# Patient Record
Sex: Female | Born: 1965 | Race: White | Hispanic: No | Marital: Married
Health system: Midwestern US, Community
[De-identification: ages and names within clinical notes are randomized; demographics above are authoritative.]

## PROBLEM LIST (undated history)

## (undated) DIAGNOSIS — M75102 Unspecified rotator cuff tear or rupture of left shoulder, not specified as traumatic: Secondary | ICD-10-CM

## (undated) DIAGNOSIS — M25512 Pain in left shoulder: Secondary | ICD-10-CM

---

## 2016-08-28 ENCOUNTER — Inpatient Hospital Stay: Admit: 2016-08-28 | Payer: PRIVATE HEALTH INSURANCE

## 2016-08-28 ENCOUNTER — Encounter

## 2016-08-28 DIAGNOSIS — M75102 Unspecified rotator cuff tear or rupture of left shoulder, not specified as traumatic: Secondary | ICD-10-CM

## 2016-09-27 ENCOUNTER — Emergency Department: Admit: 2016-09-28 | Payer: PRIVATE HEALTH INSURANCE

## 2016-09-27 DIAGNOSIS — S62145A Nondisplaced fracture of body of hamate [unciform] bone, left wrist, initial encounter for closed fracture: Secondary | ICD-10-CM

## 2016-09-27 NOTE — Discharge Instructions (Signed)
Take new medication as directed.  Do not take this medication if you have to drive, work or operate machinery as it may cause dizziness or drowsiness.  The sling has been provided for comfort.  Be sure to remove the sling several times a day to perform gentle range of motion exercises for the shoulder.  Follow-up with your orthopedist.
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
## 2016-09-27 NOTE — ED Notes (Signed)
Ice pack given to pt     Duane Boston, LPN  16/10/96 0454

## 2016-09-27 NOTE — ED Provider Notes (Signed)
Attending Supervisory Note/Shared Visit   I have personally performed a face to face diagnostic evaluation on this patient. I have reviewed the mid-level???s findings and agree.        (Please note that portions of this note were completed with a voice recognition program.  Efforts were made to edit the dictations but occasionally words are mis-transcribed.)    Wess BottsSyed Z Lulie Hurd, MD  Attending Emergency Physician        Wess BottsSyed Z Temeca Somma, MD  09/28/16 [phone]
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
## 2016-09-28 ENCOUNTER — Inpatient Hospital Stay
Admit: 2016-09-28 | Discharge: 2016-09-28 | Disposition: A | Discharge status: Home / Self Care | Payer: PRIVATE HEALTH INSURANCE | Attending: Emergency Medicine

## 2016-09-28 MED ORDER — HYDROCODONE-ACETAMINOPHEN 5-325 MG PO TABS
5-325 MG | Freq: Once | ORAL | Status: AC
Start: 2016-09-28 — End: 2016-09-27
  Administered 2016-09-28: 04:00:00 1 via ORAL

## 2016-09-28 MED ORDER — HYDROCODONE-ACETAMINOPHEN 5-325 MG PO TABS
5-325 MG | ORAL_TABLET | Freq: Four times a day (QID) | ORAL | 0 refills | Status: AC | PRN
Start: 2016-09-28 — End: 2016-10-04

## 2016-09-28 MED FILL — HYDROCODONE-ACETAMINOPHEN 5-325 MG PO TABS: 5-325 MG | ORAL | Qty: 1

## 2016-09-28 NOTE — ED Notes (Signed)
Pt educated on use of sling and to contact ortho in the am for follow up. Pt advised that splint would take 24 hrs to completely dry. Pt advised to keep in propped on pillows and to rest and ice. Pt advised to take motrin or tylenol as needed. Pt had no further questions.      Tye Juarez ScheJoesph Julyrtz, RN  09/28/16 [phone]

## 2017-12-06 ENCOUNTER — Encounter

## 2017-12-09 ENCOUNTER — Ambulatory Visit: Admit: 2017-12-09 | Discharge: 2017-12-09 | Payer: PRIVATE HEALTH INSURANCE

## 2017-12-09 ENCOUNTER — Ambulatory Visit: Admit: 2017-12-09 | Discharge: 2017-12-09 | Payer: PRIVATE HEALTH INSURANCE | Attending: Orthopaedic Surgery

## 2017-12-09 ENCOUNTER — Encounter: Attending: Orthopaedic Surgery

## 2017-12-09 DIAGNOSIS — G5602 Carpal tunnel syndrome, left upper limb: Secondary | ICD-10-CM

## 2017-12-09 DIAGNOSIS — M25512 Pain in left shoulder: Secondary | ICD-10-CM

## 2017-12-09 MED ORDER — LIDOCAINE HCL 1 % IJ SOLN
1 % | Freq: Once | INTRAMUSCULAR | Status: AC
Start: 2017-12-09 — End: 2017-12-10
  Administered 2017-12-10: 19:00:00 2 mL via INTRA_ARTICULAR

## 2017-12-09 MED ORDER — METHYLPREDNISOLONE ACETATE 40 MG/ML IJ SUSP
40 MG/ML | Freq: Once | INTRAMUSCULAR | Status: AC
Start: 2017-12-09 — End: 2017-12-10
  Administered 2017-12-10: 19:00:00 40 mg via INTRA_ARTICULAR

## 2017-12-09 NOTE — Progress Notes (Signed)
[facility]  [facility]THOPED[facility] AND SPORTS MEDICINE  [address]  [place] [address]  Dept: [phone]  Dept Fax: [phone]          Left Shoulder - Follow Up     Chief Complaint:     Chief Complaint   Patient presents with   ??? Shoulder Pain     Left shoulder pain ongoing pain does radiate to elbow and pain level is a 4 out of 10     HPI:     Kristen Barnes is a 52 y.o. year old left hand dominant female that has had pain in the left shoulder for years. As far as any trauma to the shoulder, the patient indicates no injury. The pain is worse at night and when doing overhead activities. Weakness of the shoulder has been noted. The pain restricts activities such as moving the arm and lifting things above the head. The pain does not seem to improve with time. The following medications have been tried: Aleve and the patient cannot take pain relievers without falling asleep. The patient has had a side lying corticosteroid injection into the glenohumeral joint on 10/24/2016 with good pain relief. The patient has tried physical therapy. The patient has not has surgery but she has had surgery on the opposite shoulder. The opposite shoulder is okay. Neck pain has not been present. Patient also stated the shoulder pain radiates down to the elbow and it is a 4/10. She also indicated that she has numbness and tingling in her left hand as well and the elbow locks from time to time.     Review of Systems   Constitutional: Positive for activity change. Negative for appetite change, fatigue and fever.   Respiratory: Negative for apnea, cough, chest tightness and shortness of breath.    Cardiovascular: Negative for chest pain, palpitations and leg swelling.   Gastrointestinal: Negative for abdominal distention, abdominal pain, constipation, diarrhea, nausea and vomiting.   Genitourinary: Negative for difficulty urinating, dysuria and hematuria.   Musculoskeletal: Positive for arthralgias.  Negative for gait problem, joint swelling and myalgias.   Skin: Negative for color change and rash.   Neurological: Negative for dizziness, weakness, numbness and headaches.   Psychiatric/Behavioral: Negative for sleep disturbance.     Past Medical History:    History reviewed. No pertinent past medical history.    Past Surgical History:    Past Surgical History:   Procedure Laterality Date   ??? CARPAL TUNNEL RELEASE Bilateral    ??? CESAREAN SECTION      x2   ??? HYSTERECTOMY     ??? SHOULDER SURGERY Right      Current Medications:   No current outpatient medications on file.     No current facility-administered medications for this visit.      Allergies:    Codeine    Social History:   Social History     Socioeconomic History   ??? Marital status: Married     Spouse name: None   ??? Number of children: None   ??? Years of education: None   ??? Highest education level: None   Occupational History   ??? None   Social Needs   ??? Financial resource strain: None   ??? Food insecurity:     Worry: None     Inability: None   ??? Transportation needs:     Medical: None     Non-medical: None   Tobacco Use   ???  Smoking status: Never Smoker   ??? Smokeless tobacco: Never Used   Substance and Sexual Activity   ??? Alcohol use: No   ??? Drug use: No   ??? Sexual activity: None   Lifestyle   ??? Physical activity:     Days per week: None     Minutes per session: None   ??? Stress: None   Relationships   ??? Social connections:     Talks on phone: None     Gets together: None     Attends religious service: None     Active member of club or organization: None     Attends meetings of clubs or organizations: None     Relationship status: None   ??? Intimate partner violence:     Fear of current or ex partner: None     Emotionally abused: None     Physically abused: None     Forced sexual activity: None   Other Topics Concern   ??? None   Social History Narrative   ??? None     Family History:  History reviewed. No pertinent family history.    I have reviewed the CC, HPI,  ROS, PMH, FHX, Social History, and if not present in this note, I have reviewed in the patient's chart. I agree with the documentation provided by other staff and have reviewed their documentation prior to providing my signature indicating agreement.    Vitals:   BP 135/83    Pulse 87    Resp 20    Ht  (1.575 m)    Wt 268 lb (121.6 kg)    BMI 49.02 kg/m??  Body mass index is 49.02 kg/m??.  Physical Examination:     Orthopedics:    GENERAL: Alert and oriented X3 in no acute distress.  SKIN: Intact without lesions or ulcerations.  NEURO: Musculoskeletal and axillary nerves intact to sensory and motor testing.  VASC: Capillary refill is less than 3 seconds.    Left Shoulder Exam    GEN:  Alert and oriented X 3, in no acute distress.  SKIN:  Intact without rashes, lesions, or ulcerations.  Incisions are well healed.  NEURO:  Musculoskeletal ans axillary nerves intact to sensory and motor testing.  VASC:  Cap refill less than than 3 secs. Negative Adson's test, Negative Roo's test.  ROM: 160 degrees of forward elevation, 45 degrees of external rotation in neutral, 90 degrees of external rotation in abduction, internal rotation to L1.    STRENGTH: Supraspinatus 4+/5, external rotators 4+/5.    MUSC: No atrophy, negative subscap lift off or belly press test.  IMP:  (-) Neer's sign, (-) Hawkin's sign, (-) Coracoid impingement, No painful arc, No pain with cross body abduction.  PALP: No pain over anterolateral acromion, No pain over AC joint, No pain over traps/rhomboids, bicep pain  INST: (+) O'Brien's test, (-) Speed's test.    Cervical Exam    GEN:  Alert and oriented X 3 in no acute distress  GAIT:  Normal speed and steady  SKIN:  Intact without lesions or ulcerations.  ROM: Cervical spine contour has normal lordosis.  ROM is not painful.  There is no limitation of:   Forward flexion, Extension, Right side bending, Right rotation or Left rotation. There is decreased Left side bending.  NEURO: Sensation intact to  light touch over C-5 through T-1 dermatomes.            Bicep: 2+/4  Tricep: 2+/4          Brachioradialis. 2+]/4   VASC: Cap refill less than 2 sec.  negative Adson, Roos and hyperabduction tests.    MUSC: Strength 5/5 trapezius, 5/5 deltoid, 5/5 bicep, 5/5 tricep, 5/5 wrist extensors, 5/5 finger abduction/adduction.   TESTS:    Hoffman's reflex: negative   Diadochokinesis test: negative    Babinski: negative    Clonus: negative    Spurling's maneuver: negative     Lhermitte's sign: negative   Shoulder abduction relief sign,  negative      Waddell's sign: negative     Left Elbow Exam    Gen:  Intact without rashes, lesions, or ulcerations.    Vasc: Cap refill is less than 3 seconds.      Neuro: Radial, ulnar, and median nerves are intact to sensory and motor testing. Bicep, tricep, and brachioradialis reflexes are +2/4.  Inspection: The patient is tender to palpation over the anterior aspect of the elbow. There is no effusion. No obvious deformity of the elbow.    ROM: 145 degrees of flexion, 0 degrees of extension, 85 degrees of supination, 90 degrees of pronation.     Test: Ligamentous exam is stable to varus and valgus stress testing at 0 and 30 degrees. Slightly positive Tinel's sign at elbow. No pain on resisted wrist extension.     Orthopedics     GENERAL: Alert and oriented X3 in no acute distress.  SKIN: Visual inspection reveals no soft tissue swelling or boney abnormality, no rotational deformity, Intact without lesions or ulcerations.  NEURO: Radial, Ulnar and Median nerves are intact to sensory and motor testing.  VASC: Capillary refill is less than 3 seconds, no signs of thoracic outlet syndrome, negative Allen's test    Hand/Finger/Wrist Exam    LOCATION: Left Wrist  MUSC: Good strength is available in these motions.  WRIST: No pain to palpation. No boney pain or instability to palpation.  WRIST TESTS: Negative finkelstein's, (+) Tinel's test at the left wrist, Negative Phalen's, Negative  Durkin Compression  ROM: Full flexor and extensor tendon function is intact, hands and fingers are moving well  PALPATION: Pain with resisted finger extension.     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 Incomplete rotator cuff tear or rupture of left shoulder, not specified as traumatic        Procedures:    Procedure: yes    Carpal Tunnel Syndrome Injection    Location: Left Wrist  Procedure: Corticosteroid injection into the left wrist. The patient was placed in the supine position on the exam table. The carpal tunnel portal was identified and marked. The skin was prepped with betadine in a sterile fashion. Utilizing ultrasound for precise placement and clean technique with sterile gloves a 3 cc solution containing 2 cc of 1.0% Lidocaine with 1cc containing  of Depo-medrol was injected. There was no resistance to the injection. The wound was cleansed and a band-aid was placed. The patient tolerated the procedure without difficulty. Adverse reactions to the injection were discussed with the patient including signs of infection (increasing pain, redness, swelling) and the patient was instructed to call immediately if experiencing any of these symptoms.     Radiology:   MRI from 08/28/16 was reviewed of the left shoulder.  Impression   Small near full-thickness tear of the distal supraspinatus tendon.   ??   -----------------------------------------------------------------------------------------------------------------------------  SHOULDER X-RAY    Two views  of the left shoulder and 2 views of the scapula, including AP, scapular Y, outlet and axillary views reveal: The glenohumeral joint is well reduced without arthritic changes. Proximal migration of the humeral head has not occurred.  Acromion is a type II. The acromioclavicular joint shows moderate degenerative changes.      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 We discussed the etiologies and natural histories of Double crush syndrome in the neck, rotator cuff syndrome in the left shoulder, carpal tunnel syndrome in the left wrist and cubital syndrome of the left elbow. We discussed the various treatment alternatives including anti-inflammatory medications, physical therapy, injections, further imaging studies and as a last result surgery. 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 I then told her that it may be best for [place] to get an EMG nerve test to determine what is causing the nerves to be impinged recreating the paresthesia in the hand. I also told her that I feel she may have a degenerative tear of the rotator cuff in her shoulder that may be aggravating the shoulder as well. From there, I told her that it may help if we get an MRI of the neck and it may also help if we get her into a night splint to combat the carpal tunnel and cubital tunnel syndrome. I also told her that we can try an injection into the wrist to help reduce her paresthesia in the hand and fingers. The patient then stated that she would like to do the night splint and a cortisone injection into the wrist. She will also refrain from resting her elbow on things so it does not exacerbate the pain in her elbow. At this time, the patient has opted for a carpal tunnel night splint and a cortisone injection into the carpal tunnel of the left wrist to help reduce inflammation and pain. The injection site should never get red, hot, or swollen and if it does the patient will contact our office right away. The patient may experience a increase in soreness the first  24-48 hours due to a cortisone flair and can take anti-inflammatories for a short period of time to reduce that soreness. The patient should not submerge the injection site in water for a minimum of 24 hours to avoid infection. This means no lakes, pools, ponds, or hot tubs for 24 hours. If the patient is diabetic the injection may increase their blood sugar for up to one week. The patient can do this cortisone injection once every 3 months as needed. If the injections stop working and do not give the patient relief the patient should consider surgical interventions to produce long term relief. Patient should return to the clinic in 6 weeks to follow up with Ross Ludwig PA-C, ATC if she is not better. The patient will call the office immediately with any problems.      Orders Placed This Encounter   Medications   ??? lidocaine 1 % injection 2 mL   ??? methylPREDNISolone acetate (DEPO-MEDROL)  injection 40 mg     No orders of the defined types were placed in this encounter.    Bethena Midget Day V, am scribing for and in the presence of Linde Gillis D.O.  12/15/2017  9:24 PM    I, Linde Gillis DO, have personally seen this patient, reviewed the chart including history, and imaging if done. I personally  performed the physical exam and obtained any needed additional history. I placed orders, performed or supervised procedures and developed the treatment plan.    Electronically signed by Liston Alba, DO, on 12/15/2017 at 9:24 PM

## 2018-01-21 NOTE — Progress Notes (Signed)
Is 82     [facility]  [facility]  [address]  [place] [id number]  Dept: [phone]  Dept Fax: [phone]        Left Shoulder - Follow Up     Chief Complaint:     Chief Complaint   Patient presents with   ??? Follow-up     Lt wrist rechech, carpal tunnel syndrome. Cortisone injection 12/09/17, pt states that it helped.      HPI:     Kristen Barnes is a [age] y.o. year old right hand dominant female that has had pain in the left shoulder for years. As far as any trauma to the shoulder, the patient indicates no injury. The pain is worse at night and when doing overhead activities. Weakness of the shoulder has been noted. The pain restricts activities such as moving the arm and lifting things above the head. The pain does not seem to improve with time. The following medications have been tried: Aleve and the patient cannot take pain relievers without falling asleep. The patient has had a side lying corticosteroid injection into the glenohumeral joint on 10/24/2016 with good pain relief. The patient has tried physical therapy. The patient has not has surgery but she has had surgery on the opposite shoulder. The opposite shoulder is okay. Neck pain has not been present. Patient also stated the shoulder pain radiates down to the elbow and it is a 4/10. She also indicated that she has numbness and tingling in her left hand as well and the elbow locks from time to time. Patient states that her wrist feels a lot better since the injection she received on 12/09/17.     Review of Systems   Constitutional: Positive for activity change. Negative for appetite change, fatigue and fever.   Respiratory: Negative for apnea, cough, chest tightness and shortness of breath.    Cardiovascular: Negative for chest pain, palpitations and leg swelling.   Gastrointestinal: Negative for abdominal distention, abdominal pain, constipation, diarrhea, nausea and vomiting.    Genitourinary: Negative for difficulty urinating, dysuria and hematuria.   Musculoskeletal: Positive for arthralgias. Negative for gait problem, joint swelling and myalgias.   Skin: Negative for color change and rash.   Neurological: Negative for dizziness, weakness, numbness and headaches.   Psychiatric/Behavioral: Negative for sleep disturbance.     Past Medical History:    History reviewed. No pertinent past medical history.    Past Surgical History:    Past Surgical History:   Procedure Laterality Date   ??? CARPAL TUNNEL RELEASE Bilateral    ??? CESAREAN SECTION      x2   ??? HYSTERECTOMY     ??? SHOULDER SURGERY Right      Current Medications:   Current Outpatient Medications   Medication Sig Dispense Refill   ??? furosemide (LASIX) 20 MG tablet Take 20 mg by mouth daily  0   ??? FLUoxetine (PROZAC) 20 MG capsule Take 20 mg by mouth daily  0     No current facility-administered medications for this visit.      Allergies:    Codeine    Social History:   Social History     Socioeconomic History   ??? Marital status: Married     Spouse name: None   ??? Number of children: None   ??? Years of education: None   ??? Highest education level: None   Occupational History   ??? None  Social Needs   ??? Financial resource strain: None   ??? Food insecurity:     Worry: None     Inability: None   ??? Transportation needs:     Medical: None     Non-medical: None   Tobacco Use   ??? Smoking status: Never Smoker   ??? Smokeless tobacco: Never Used   Substance and Sexual Activity   ??? Alcohol use: No   ??? Drug use: No   ??? Sexual activity: None   Lifestyle   ??? Physical activity:     Days per week: None     Minutes per session: None   ??? Stress: None   Relationships   ??? Social connections:     Talks on phone: None     Gets together: None     Attends religious service: None     Active member of club or organization: None     Attends meetings of clubs or organizations: None     Relationship status: None   ??? Intimate partner violence:     Fear of current or ex  partner: None     Emotionally abused: None     Physically abused: None     Forced sexual activity: None   Other Topics Concern   ??? None   Social History Narrative   ??? None     Family History:  History reviewed. No pertinent family history.    I have reviewed the CC, HPI, ROS, PMH, FHX, Social History, and if not present in this note, I have reviewed in the patient's chart. I agree with the documentation provided by other staff and have reviewed their documentation prior to providing my signature indicating agreement.    Vitals:   BP 133/81    Pulse 62    Ht 5\' 2"  (1.575 m)    Wt 260 lb (117.9 kg)    BMI 47.55 kg/m??  Body mass index is 47.55 kg/m??.  Physical Examination:     Orthopedics:    GENERAL: Alert and oriented X3 in no acute distress.  SKIN: Intact without lesions or ulcerations.  NEURO: Musculoskeletal and axillary nerves intact to sensory and motor testing.  VASC: Capillary refill is less than 3 seconds.    Left Shoulder Exam    GEN: Alert and oriented X 3, in no acute distress.  SKIN: Intact without rashes, lesions, or ulcerations.   NEURO: Musculoskeletal ans axillary nerves intact to sensory and motor testing.  VASC: Cap refill less than than 3 secs. Negative Adson's test, Negative Roo's test.  ROM: 170 degrees of forward elevation, 65 degrees of external rotation in neutral, 90 degrees of external rotation in abduction, internal rotation to T8.    STRENGTH: Supraspinatus 5/5, external rotators 5/5.    MUSC: No atrophy, negative subscap lift off, (-) Bear hug test, (-) belly press test.  IMP:  (+) Neer's sign, (-) Hawkin's sign, no painful arc, no pain with cross body abduction.  PALP: no pain over anterolateral acromion, no pain over AC joint, no pain over traps/rhomboids.  INST: (-) O'Brien's test.    Cervical Spine Exam    GEN:  Alert and oriented X 3 in no acute distress  GAIT:  Normal speed and steady  SKIN:  Intact without lesions or ulcerations.  ROM: Cervical spine contour has normal lordosis.  ROM is not painful. There is no limitation of: Forward flexion, Extension, Left side bending, Right rotation or Left rotation. Pain with Right side bending  PALP: Palpation reveals paraspinal pain on the left. Trapezius trigger point on the left side.  NEURO: Sensation intact to light touch over C-5 through T-1 dermatomes.            Bicep: 2/4           Tricep: 2/4          Brachioradialis: 2/4   VASC: Cap refill less than 2 sec. negative Adson, Roos and hyperabduction tests.    MUSC: Strength 5/5 trapezius, 5/5 deltoid, 5/5 bicep, 5/5 tricep, 5/5 wrist extensors, 5/5 finger abduction/adduction.   TESTS:    Hoffman's reflex: negative   Diadochokinesis test: negative    Babinski: negative    Clonus: negative    Spurling's maneuver: negative     Lhermitte's sign: negative   Shoulder abduction relief sign,  negative      Waddell's sign: negative     Assessment:     1. Cervical radiculopathy    2. Rotator cuff syndrome of left shoulder        Procedures:    Procedure: no    Radiology:   X-ray was reviewed from 12/09/17.    Plan:   Treatment : I reviewed the X-ray with the patient. We discussed the etiologies and natural histories of Cervical radiculopathy and rotator cuff syndrome in the left shoulder. We discussed the various treatment alternatives including anti-inflammatory medications, physical therapy, injections, further imaging studies and as a last result surgery. During today's visit, I explained to the patient that I feel she should attend physical therapy for her shoulder and for her neck. The patient then stated that she agrees with the plan of care. So at this time, the patient has opted for a physical therapy script. A physical therapy prescription was given. Patient and her husband then stated that they are going to [place] and they will not be here in 6 weeks. I then told them that we can see them back at a later date. Patient should return to the clinic in 6-8 weeks to follow up with Linde Gillis D.O. The  patient will call the office immediately with any problems.      No orders of the defined types were placed in this encounter.    Orders Placed This Encounter   Procedures   ??? [facility]     Referral Priority:   Routine     Referral Type:   Eval and Treat     Referral Reason:   Specialty Services Required     Requested Specialty:   Physical Therapy     Number of Visits Requested:   1     I, Edward Day V, am scribing for and in the presence of Adolm Joseph, ATC. 01/21/2018  10:39 PM    I, Ross Ludwig PA-C, ATC,  have personally seen this patient, reviewed the chart including history, and imaging if done. I personally  performed the physical exam and obtained any needed additional history. I placed orders, performed or supervised procedures and developed the treatment plan.    Electronically signed by Raliegh Ip, PA-C, on 01/21/2018 at 10:40 PM    Electronically signed by Raliegh Ip, PA-C, on 01/21/2018 at 10:39 PM

## 2018-03-18 ENCOUNTER — Encounter: Attending: Surgical

## 2023-04-16 IMAGING — MR MRI KNEE RT W/O CONTRAST
5 series · 34 of 40 positions shown · IV contrast (gadolinium)
Comparison: None previous.

﻿EXAM:  [id number]   MRI KNEE RT W/O CONTRAST
INDICATION: 56-year-old with right knee pain after sustaining trauma and twisting injury to the knee.  No history of knee surgery.  Chondromalacia of patella.
TECHNIQUE: Multiplanar, multisequential MRI of the right knee was performed without gadolinium contrast.

[Series 5: PD fat-sat · axial · right · 4.5mm · 0.33mm/px · z∈[-124,+26]mm · 8 of 31 slices shown (1 of 3)]
[im 1/31]
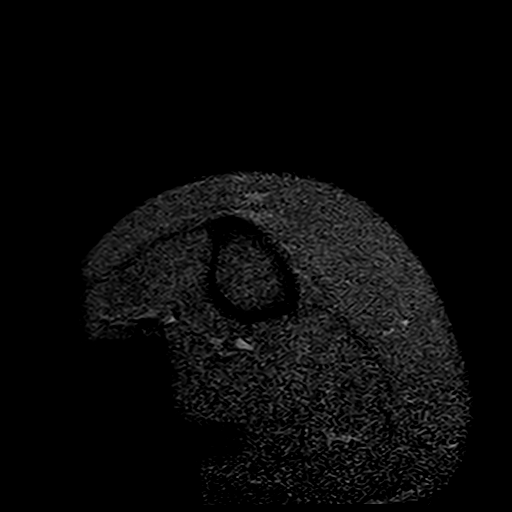
[im 5/31]
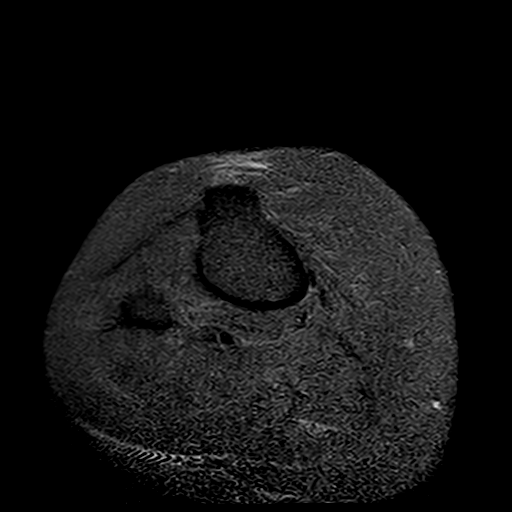
[im 9/31]
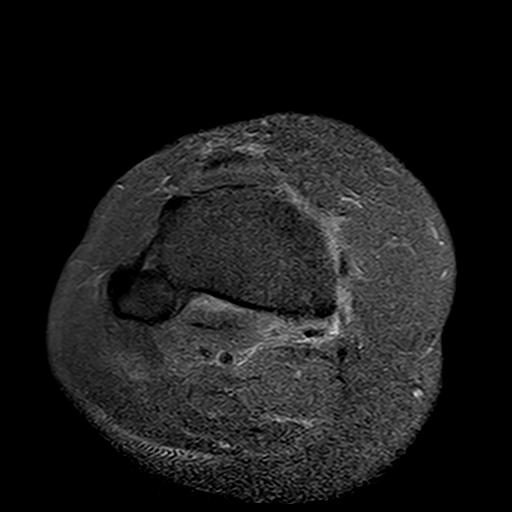
[im 13/31]
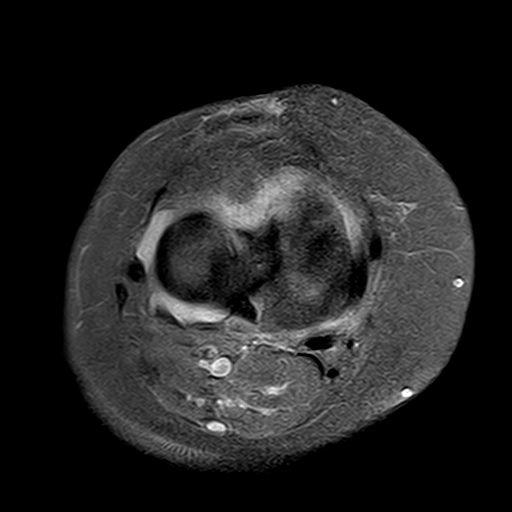
[im 18/31]
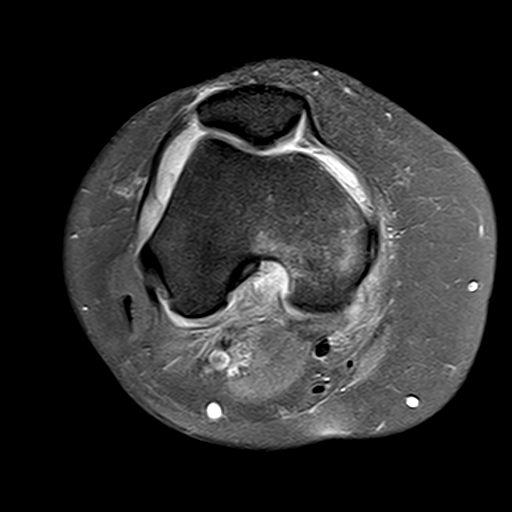
[im 22/31]
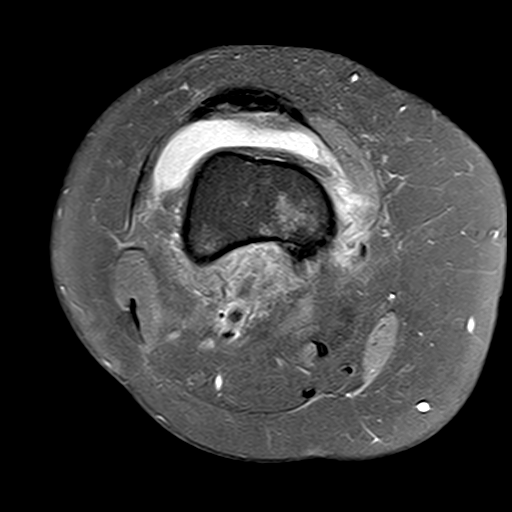
[im 26/31]
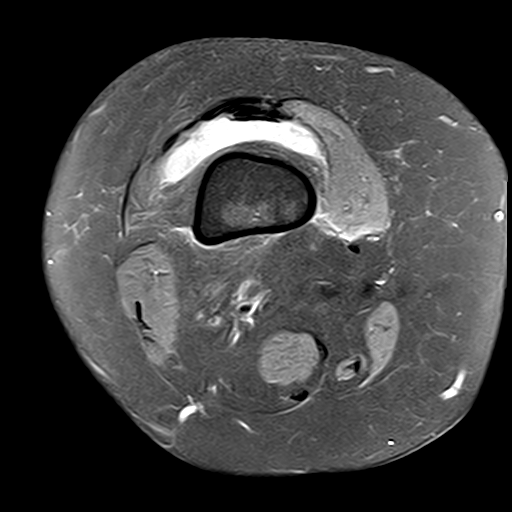
[im 31/31]
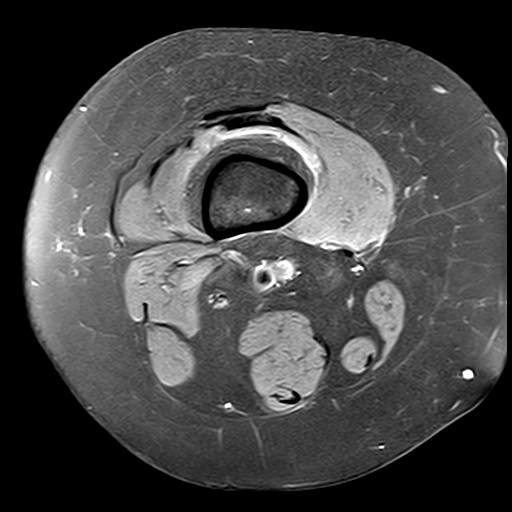

[Series 6: PD fat-sat · sagittal · right · 4.0mm · 0.53mm/px · 8 of 31 slices shown (2 of 3)]
[im 1/31]
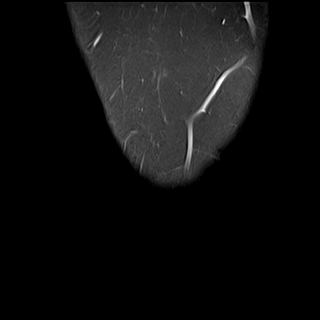
[im 5/31]
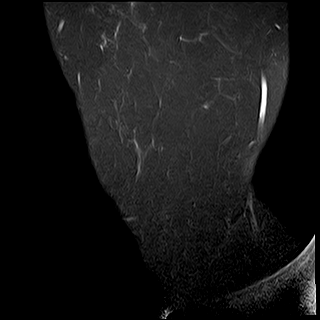
[im 9/31]
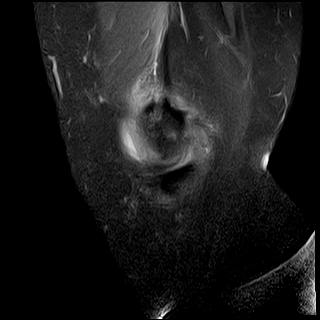
[im 13/31]
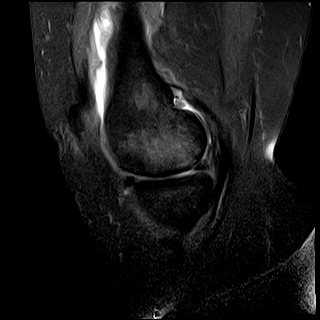
[im 18/31]
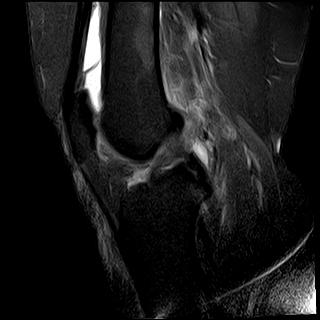
[im 22/31]
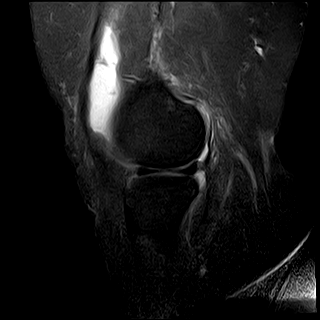
[im 26/31]
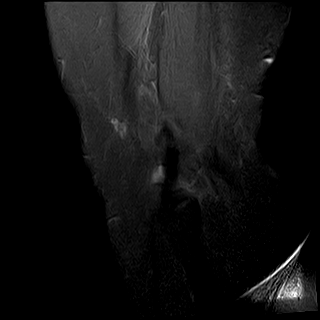
[im 31/31]
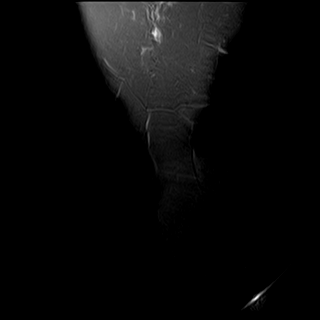

[Series 7: T1 · sagittal · right · 4.0mm · 0.53mm/px · 8 of 31 slices shown]
[im 1/31]
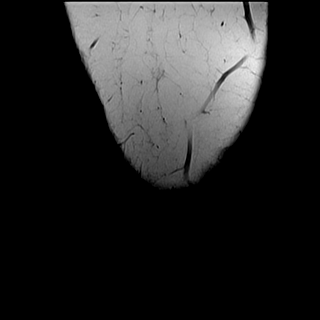
[im 5/31]
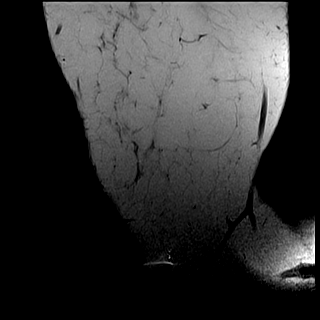
[im 9/31]
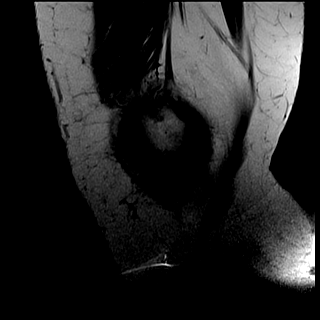
[im 13/31]
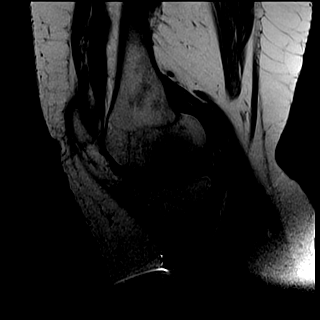
[im 18/31]
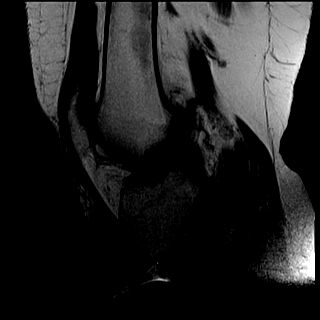
[im 22/31]
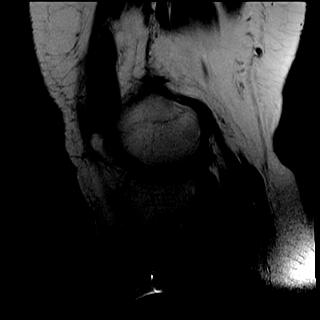
[im 26/31]
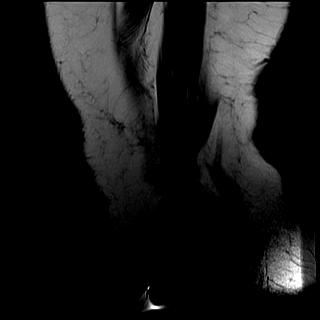
[im 31/31]
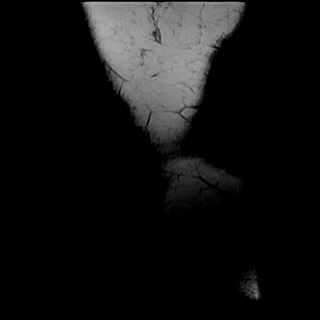

[Series 8: STIR · coronal · right · 3.5mm · 0.59mm/px · 2 of 33 slices shown]
[im 1/33]
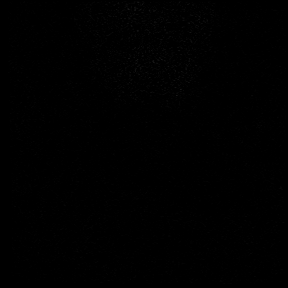
[im 5/33]
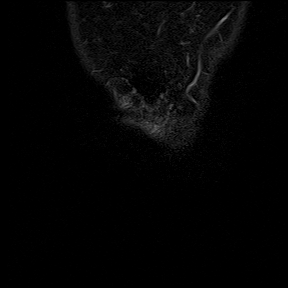

[Series 9: PD fat-sat · coronal · right · 3.5mm · 0.53mm/px · 8 of 33 slices shown (3 of 3)]
[im 1/33]
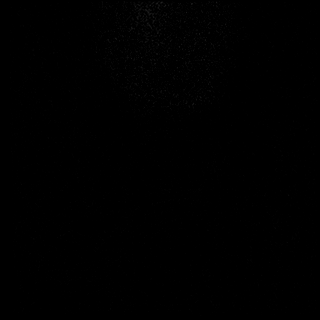
[im 5/33]
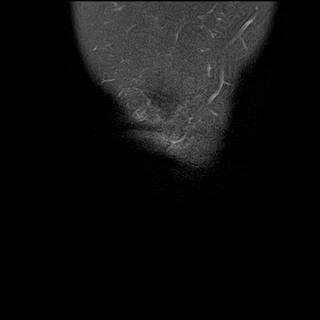
[im 10/33]
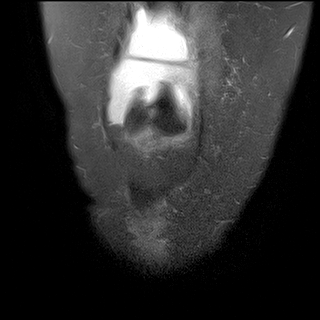
[im 14/33]
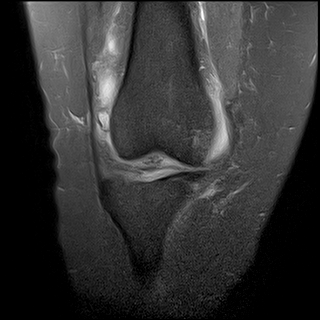
[im 19/33]
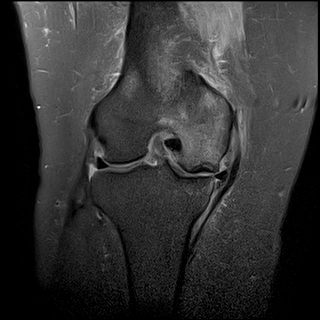
[im 23/33]
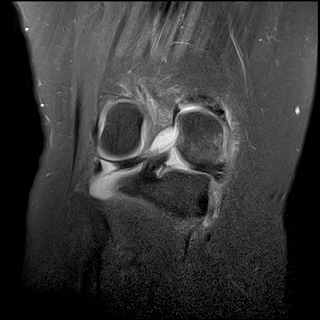
[im 28/33]
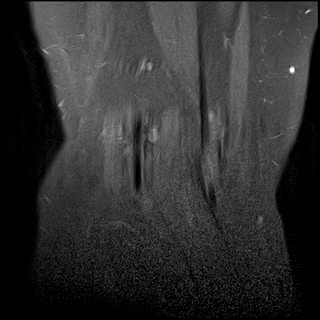
[im 33/33]
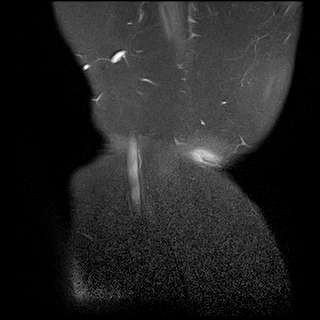

[34 of 40 positions shown; findings below may reference images not displayed]

FINDINGS: The lateral meniscus and lateral articular cartilage are normal. 

Osteochondral lesion with irregular articular cartilage surface over the medial femoral condyle is noted with large area of surrounding bone marrow edema of the medial femoral condyle.  Mild flattening of the articular surface of the medial femoral condyle is noted. 

Significant degenerative changes of posterior horn of medial meniscus are noted with significant medial extrusion of mid medial meniscus.  No definite displaced meniscal tear. Questionable nondisplaced tear of mid medial meniscus.  Grade 3 degenerative changes of articular cartilage over the medial tibial plateau are noted. 

Cruciate ligaments and the collateral ligaments are intact.  Good size effusion in the knee joint. Small Baker's cyst in the popliteal fossa. 

Quadriceps tendon and patellar tendon are intact. Mild infrapatellar bursitis.
IMPRESSION: 1. Evidence of grade 3 to grade 4 osteochondral lesion of the medial femoral condyle with significant bone marrow edema of the medial femoral condyle.  Grade 3 degenerative changes of articular cartilage over medial tibial condyle.  Moderate irregularity of the articular surface of medial femoral condyle.

2. Significant degenerative changes of posterior horn of medial meniscus with significant medial extrusion of mid medial meniscus.  Questionable nondisplaced tear of mid medial meniscus is noted. No definite displaced tear is seen.

3. Articular cartilage of patellofemoral joint is normal. Good size effusion in the knee joint.  Small Baker's cyst. Mild infrapatellar bursitis.
